# Patient Record
Sex: Female | Born: 1990 | Race: Black or African American | Hispanic: No | Marital: Single | Smoking: Former smoker
Health system: Southern US, Community
[De-identification: ages and names within clinical notes are randomized; demographics above are authoritative.]

## PROBLEM LIST (undated history)

## (undated) DIAGNOSIS — F329 Major depressive disorder, single episode, unspecified: Secondary | ICD-10-CM

## (undated) DIAGNOSIS — F32A Depression, unspecified: Secondary | ICD-10-CM

## (undated) DIAGNOSIS — K259 Gastric ulcer, unspecified as acute or chronic, without hemorrhage or perforation: Secondary | ICD-10-CM

## (undated) DIAGNOSIS — J449 Chronic obstructive pulmonary disease, unspecified: Secondary | ICD-10-CM

## (undated) HISTORY — PX: WISDOM TOOTH EXTRACTION: SHX21

---

## 2016-03-16 ENCOUNTER — Emergency Department (HOSPITAL_BASED_OUTPATIENT_CLINIC_OR_DEPARTMENT_OTHER)
Admission: EM | Admit: 2016-03-16 | Discharge: 2016-03-16 | Disposition: A | Discharge status: Home / Self Care | Payer: Medicaid Other | Attending: Emergency Medicine | Admitting: Emergency Medicine

## 2016-03-16 ENCOUNTER — Encounter (HOSPITAL_BASED_OUTPATIENT_CLINIC_OR_DEPARTMENT_OTHER): Payer: Self-pay | Admitting: Emergency Medicine

## 2016-03-16 DIAGNOSIS — J449 Chronic obstructive pulmonary disease, unspecified: Secondary | ICD-10-CM | POA: Diagnosis not present

## 2016-03-16 DIAGNOSIS — R51 Headache: Secondary | ICD-10-CM | POA: Diagnosis not present

## 2016-03-16 DIAGNOSIS — F17200 Nicotine dependence, unspecified, uncomplicated: Secondary | ICD-10-CM | POA: Diagnosis not present

## 2016-03-16 DIAGNOSIS — R05 Cough: Secondary | ICD-10-CM | POA: Insufficient documentation

## 2016-03-16 DIAGNOSIS — R509 Fever, unspecified: Secondary | ICD-10-CM | POA: Diagnosis present

## 2016-03-16 DIAGNOSIS — R69 Illness, unspecified: Secondary | ICD-10-CM

## 2016-03-16 DIAGNOSIS — J111 Influenza due to unidentified influenza virus with other respiratory manifestations: Secondary | ICD-10-CM

## 2016-03-16 HISTORY — DX: Chronic obstructive pulmonary disease, unspecified: J44.9

## 2016-03-16 MED ORDER — OSELTAMIVIR PHOSPHATE 75 MG PO CAPS: 75.0000 mg | ORAL_CAPSULE | Freq: Two times a day (BID) | ORAL | 0 refills | Status: DC

## 2016-03-16 NOTE — Discharge Instructions (Signed)
It was our pleasure to provide your ER care today - we hope that you feel better.  Rest. Drink plenty of fluids.  Take tamiflu as prescribed.  You may also try over the counter cold and flu medication as need for symptom relief.  Follow up with primary care doctor in 1 week if symptoms fail to improve/resolve.  Return to ER if worse, trouble breathing, other concern.

## 2016-03-16 NOTE — ED Triage Notes (Signed)
Nasal congestion, sore throat, cough, fever, body aches since Saturday/sunday.  No N/V/D.

## 2016-03-16 NOTE — ED Provider Notes (Signed)
[facility]   CSN: [id number] Arrival date & time: 03/16/16  [id number]     History   Chief Complaint Chief Complaint  Patient presents with  . URI    HPI Kara Fischer is a 26 y.o. female.  Patient c/o fever, scratchy throat, non prod cough, intermittent frontal headaches, and body aches, for the past 1-2 days. Symptoms constant, persistent, acute in onset. Recent ill contacts w uri symptoms. No sob. No chest pain. No abd pain or nvd. No gu c/o. No neck pain or stiffness. No hx chr illness.     URI   Associated symptoms include congestion, headaches and cough. Pertinent negatives include no chest pain, no abdominal pain, no dysuria, no neck pain and no rash.    Past Medical History:  Diagnosis Date  . COPD (chronic obstructive pulmonary disease) (HCC)     There are no active problems to display for this patient.   No past surgical history on file.  OB History    No data available       Home Medications    Prior to Admission medications   Not on File    Family History No family history on file.  Social History Social History  Substance Use Topics  . Smoking status: Current Every Day Smoker  . Smokeless tobacco: Never Used  . Alcohol use Not on file     Allergies   Patient has no known allergies.   Review of Systems Review of Systems  Constitutional: Positive for fever.  HENT: Positive for congestion.   Eyes: Negative for redness.  Respiratory: Positive for cough. Negative for shortness of breath.   Cardiovascular: Negative for chest pain.  Gastrointestinal: Negative for abdominal pain.  Genitourinary: Negative for dysuria.  Musculoskeletal: Positive for myalgias. Negative for back pain, neck pain and neck stiffness.  Skin: Negative for rash.  Neurological: Positive for headaches.  Hematological: Does not bruise/bleed easily.  Psychiatric/Behavioral: Negative for confusion.     Physical Exam Updated Vital Signs BP  116/80 (BP Location: Left Arm)   Pulse 74   Temp 98 F (36.7 C) (Oral)   Resp 18   Ht 5\' 4"  (1.626 m)   Wt 77.1 kg   LMP 03/07/2016   SpO2 100%   BMI 29.18 kg/m   Physical Exam  Constitutional: She appears well-developed and well-nourished. No distress.  HENT:  Mouth/Throat: Oropharynx is clear and moist.  Nasal congestion  Eyes: Conjunctivae are normal. No scleral icterus.  Neck: Neck supple. No tracheal deviation present.  No stiffness or rigidity  Cardiovascular: Normal rate, regular rhythm, normal heart sounds and intact distal pulses.  Exam reveals no gallop and no friction rub.   No murmur heard. Pulmonary/Chest: Effort normal and breath sounds normal. No respiratory distress.  Abdominal: Soft. Normal appearance. She exhibits no distension. There is no tenderness.  Genitourinary:  Genitourinary Comments: No cva tenderness  Musculoskeletal: She exhibits no edema.  Neurological: She is alert.  Skin: Skin is warm and dry. No rash noted. She is not diaphoretic.  Psychiatric: She has a normal mood and affect.  Nursing note and vitals reviewed.    ED Treatments / Results  Labs (all labs ordered are listed, but only abnormal results are displayed) Labs Reviewed - No data to display  EKG  EKG Interpretation None       Radiology No results found.  Procedures Procedures (including critical care time)  Medications Ordered in ED Medications - No data to display  Initial Impression / Assessment and Plan / ED Course  I have reviewed the triage vital signs and the nursing notes.  Pertinent labs & imaging results that were available during my care of the patient were reviewed by me and considered in my medical decision making (see chart for details).  pts symptoms and exam felt most c/w influenza like illness.  Will give rx tamiflu.  Return precautions provided.    Final Clinical Impressions(s) / ED Diagnoses   Final diagnoses:  None    New  Prescriptions New Prescriptions   No medications on file     Cathren Laine, MD 03/16/16 [phone]

## 2016-04-18 ENCOUNTER — Emergency Department (HOSPITAL_BASED_OUTPATIENT_CLINIC_OR_DEPARTMENT_OTHER)
Admission: EM | Admit: 2016-04-18 | Discharge: 2016-04-18 | Disposition: A | Discharge status: Home / Self Care | Payer: Medicaid Other | Attending: Emergency Medicine | Admitting: Emergency Medicine

## 2016-04-18 ENCOUNTER — Encounter (HOSPITAL_BASED_OUTPATIENT_CLINIC_OR_DEPARTMENT_OTHER): Payer: Self-pay

## 2016-04-18 DIAGNOSIS — L02416 Cutaneous abscess of left lower limb: Secondary | ICD-10-CM | POA: Diagnosis present

## 2016-04-18 DIAGNOSIS — F17200 Nicotine dependence, unspecified, uncomplicated: Secondary | ICD-10-CM | POA: Diagnosis not present

## 2016-04-18 DIAGNOSIS — Z79899 Other long term (current) drug therapy: Secondary | ICD-10-CM | POA: Diagnosis not present

## 2016-04-18 DIAGNOSIS — J449 Chronic obstructive pulmonary disease, unspecified: Secondary | ICD-10-CM | POA: Diagnosis not present

## 2016-04-18 DIAGNOSIS — L02419 Cutaneous abscess of limb, unspecified: Secondary | ICD-10-CM

## 2016-04-18 HISTORY — DX: Depression, unspecified: F32.A

## 2016-04-18 HISTORY — DX: Major depressive disorder, single episode, unspecified: F32.9

## 2016-04-18 MED ORDER — LIDOCAINE HCL (PF) 1 % IJ SOLN
5.0000 mL | Freq: Once | INTRAMUSCULAR | Status: AC
  Administered 2016-04-18: 5 mL
  Filled 2016-04-18: qty 5

## 2016-04-18 MED ORDER — SULFAMETHOXAZOLE-TRIMETHOPRIM 800-160 MG PO TABS: 1.0000 | ORAL_TABLET | Freq: Two times a day (BID) | ORAL | 0 refills | Status: AC

## 2016-04-18 MED FILL — SULFAMETHOXAZOLE/TMP DS TAB: 800-160 | 7 days supply | Qty: 14 | Fill #0

## 2016-04-18 NOTE — ED Triage Notes (Signed)
C/o abscess to left inner thigh x 1 week-NAD-slow steady gait

## 2016-04-18 NOTE — ED Provider Notes (Signed)
[facility]   CSN: [id number] Arrival date & time: 04/18/16  1226     History   Chief Complaint Chief Complaint  Patient presents with  . Abscess    HPI Kara Fischer is a 26 y.o. female.  HPI  26 y.o. femalepresents to the Emergency Department today complaining of left thigh abscess x 1 week. Notes worsening over that time period with it growing. Attempted warm compresses without minimal relief. No N/V. No fevers. Minimal pain currently. No other symptoms noted.   Past Medical History:  Diagnosis Date  . COPD (chronic obstructive pulmonary disease) (HCC)   . Depression     There are no active problems to display for this patient.   History reviewed. No pertinent surgical history.  OB History    No data available       Home Medications    Prior to Admission medications   Medication Sig Start Date End Date Taking? Authorizing Provider  ARIPiprazole (ABILIFY PO) Take by mouth.   Yes Historical Provider, MD  TRAZODONE HCL PO Take by mouth.   Yes Historical Provider, MD    Family History No family history on file.  Social History Social History  Substance Use Topics  . Smoking status: Current Every Day Smoker  . Smokeless tobacco: Never Used  . Alcohol use No     Allergies   Patient has no known allergies.   Review of Systems Review of Systems ROS reviewed and all are negative for acute change except as noted in the HPI.  Physical Exam Updated Vital Signs BP 134/89 (BP Location: Left Arm)   Pulse 107   Temp 98.2 F (36.8 C) (Oral)   Resp 18   Ht 5\' 4"  (1.626 m)   Wt 83.3 kg   LMP 04/08/2016   SpO2 100%   BMI 31.53 kg/m   Physical Exam  Constitutional: She is oriented to person, place, and time. Vital signs are normal. She appears well-developed and well-nourished.  HENT:  Head: Normocephalic and atraumatic.  Right Ear: Hearing normal.  Left Ear: Hearing normal.  Eyes: Conjunctivae and EOM are normal. Pupils  are equal, round, and reactive to light.  Neck: Normal range of motion. Neck supple.  Cardiovascular: Normal rate, regular rhythm, normal heart sounds and intact distal pulses.   Pulmonary/Chest: Effort normal.  Abdominal: Soft.  Musculoskeletal: Normal range of motion.  1cm induration left inner thigh. Surrounding erythema. No purulence  Neurological: She is alert and oriented to person, place, and time.  Skin: Skin is warm and dry.  Psychiatric: She has a normal mood and affect. Her speech is normal and behavior is normal. Thought content normal.  Nursing note and vitals reviewed.  ED Treatments / Results  Labs (all labs ordered are listed, but only abnormal results are displayed) Labs Reviewed - No data to display  EKG  EKG Interpretation None      Radiology No results found.  Procedures .Marland KitchenIncision and Drainage Date/Time: 04/18/2016 3:37 PM Performed by: Audry Pili Authorized by: Audry Pili   Consent:    Consent obtained:  Verbal   Consent given by:  Patient Location:    Type:  Abscess   Size:  1   Location:  Lower extremity   Lower extremity location:  Leg   Leg location:  L upper leg Pre-procedure details:    Skin preparation:  Betadine Anesthesia (see MAR for exact dosages):    Anesthesia method:  Local infiltration   Local anesthetic:  Lidocaine 1% w/o epi Procedure type:    Complexity:  Simple Procedure details:    Incision types:  Stab incision   Incision depth:  Dermal   Scalpel blade:  15   Wound management:  Probed and deloculated   Drainage:  Purulent   Drainage amount:  Moderate   Wound treatment:  Wound left open   Packing materials:  1/2 in gauze Post-procedure details:    Patient tolerance of procedure:  Tolerated well, no immediate complications   (including critical care time)  Medications Ordered in ED Medications  lidocaine (PF) (XYLOCAINE) 1 % injection 5 mL (5 mLs Infiltration Given 04/18/16 1513)    Initial Impression /  Assessment and Plan / ED Course  I have reviewed the triage vital signs and the nursing notes.  Pertinent labs & imaging results that were available during my care of the patient were reviewed by me and considered in my medical decision making (see chart for details).  Final Clinical Impressions(s) / ED Diagnoses     {I have reviewed the relevant previous healthcare records.  {I obtained HPI from historian.   ED Course:  Assessment: Pt is a 26 y.o. female who presents to the Emergency Department with skin abscess amenable to incision and drainage. Mild erythema surrounding skin. Given Rx Abx, Will d/c to home. Strict return precautions given. Follow up with PCP.  Disposition/Plan:  DC Home Additional Verbal discharge instructions given and discussed with patient.  Pt Instructed to f/u with PCP in the next week for evaluation and treatment of symptoms. Return precautions given Pt acknowledges and agrees with plan  Supervising Physician Vanetta MuldersScott Zackowski, MD  Final diagnoses:  Thigh abscess    New Prescriptions New Prescriptions   No medications on file     Audry Piliyler Aubriegh Minch, PA-C 04/18/16 1538    Vanetta MuldersScott Zackowski, MD 04/20/16 [phone]

## 2016-04-18 NOTE — Discharge Instructions (Signed)
Please read and follow all provided instructions.  Your diagnoses today include:  1. Thigh abscess     Tests performed today include: Vital signs. See below for your results today.   Medications prescribed:   Take any prescribed medications only as directed.   Home care instructions:  Follow any educational materials contained in this packet  Follow-up instructions: Return to the Emergency Department in 48 hours for a recheck if your symptoms are not significantly improved.  Please follow-up with your primary care provider in the next 1 week for further evaluation of your symptoms.   Return instructions:  Return to the Emergency Department if you have: Fever Worsening symptoms Worsening pain Worsening swelling Redness of the skin that moves away from the affected area, especially if it streaks away from the affected area  Any other emergent concerns  Additional Information: If you have recurrent abscesses, try both the following. Use a Qtip to apply an over-the-counter antibiotic to the inside of your nostrils, twice a day for 5 days. Wash your body with over-the-counter Hibaclens once a day for one week and then once every two weeks. This can reduce the amount of bacterial on your skin that causes boils and lead to fewer boils. If you continue to have multiple or recurrent boils, you should see a dermatologist (skin doctor).   Your vital signs today were: BP 120/76 (BP Location: Right Arm)    Pulse 87    Temp 98.2 F (36.8 C) (Oral)    Resp 16    Ht 5\' 4"  (1.626 m)    Wt 83.3 kg    LMP 04/08/2016    SpO2 100%    BMI 31.53 kg/m  If your blood pressure (BP) was elevated above 135/85 this visit, please have this repeated by your doctor within one month. --------------

## 2016-05-17 ENCOUNTER — Emergency Department (HOSPITAL_BASED_OUTPATIENT_CLINIC_OR_DEPARTMENT_OTHER)
Admission: EM | Admit: 2016-05-17 | Discharge: 2016-05-17 | Disposition: A | Discharge status: Home / Self Care | Payer: Medicaid Other | Attending: Emergency Medicine | Admitting: Emergency Medicine

## 2016-05-17 ENCOUNTER — Encounter (HOSPITAL_BASED_OUTPATIENT_CLINIC_OR_DEPARTMENT_OTHER): Payer: Self-pay | Admitting: *Deleted

## 2016-05-17 DIAGNOSIS — J449 Chronic obstructive pulmonary disease, unspecified: Secondary | ICD-10-CM | POA: Insufficient documentation

## 2016-05-17 DIAGNOSIS — F17200 Nicotine dependence, unspecified, uncomplicated: Secondary | ICD-10-CM | POA: Insufficient documentation

## 2016-05-17 DIAGNOSIS — G43009 Migraine without aura, not intractable, without status migrainosus: Secondary | ICD-10-CM

## 2016-05-17 DIAGNOSIS — R51 Headache: Secondary | ICD-10-CM | POA: Diagnosis present

## 2016-05-17 LAB — PREGNANCY, URINE: PREG TEST UR: NEGATIVE

## 2016-05-17 MED ORDER — METOCLOPRAMIDE HCL 5 MG/ML IJ SOLN
10.0000 mg | Freq: Once | INTRAMUSCULAR | Status: AC
  Administered 2016-05-17: 10 mg via INTRAVENOUS
  Filled 2016-05-17: qty 2

## 2016-05-17 MED ORDER — SODIUM CHLORIDE 0.9 % IV BOLUS (SEPSIS)
1000.0000 mL | Freq: Once | INTRAVENOUS | Status: AC
  Administered 2016-05-17: 1000 mL via INTRAVENOUS

## 2016-05-17 MED ORDER — DEXAMETHASONE SODIUM PHOSPHATE 10 MG/ML IJ SOLN
10.0000 mg | Freq: Once | INTRAMUSCULAR | Status: AC
  Administered 2016-05-17: 10 mg via INTRAVENOUS
  Filled 2016-05-17: qty 1

## 2016-05-17 MED ORDER — KETOROLAC TROMETHAMINE 30 MG/ML IJ SOLN
30.0000 mg | Freq: Once | INTRAMUSCULAR | Status: AC
  Administered 2016-05-17: 30 mg via INTRAVENOUS
  Filled 2016-05-17: qty 1

## 2016-05-17 MED ORDER — DIPHENHYDRAMINE HCL 50 MG/ML IJ SOLN
25.0000 mg | Freq: Once | INTRAMUSCULAR | Status: AC
  Administered 2016-05-17: 25 mg via INTRAVENOUS
  Filled 2016-05-17: qty 1

## 2016-05-17 NOTE — ED Notes (Signed)
PT states understanding of care given, follow up care. PT ambulated from ED to car with a steady gait.  

## 2016-05-17 NOTE — ED Provider Notes (Signed)
[facility]   CSN: [id number] Arrival date & time: 05/17/16  1447     History   Chief Complaint Chief Complaint  Patient presents with  . Headache    HPI Kara Fischer is a 26 y.o. female.  The history is provided by the patient.  Headache   This is a new problem. Episode onset: 3 days. The problem occurs constantly. The problem has been gradually worsening. The headache is associated with bright light and loud noise. The pain is located in the temporal and frontal region. The quality of the pain is described as dull. The pain is moderate. The pain radiates to the left neck and right neck. Pertinent negatives include no fever, no syncope and no vomiting. She has tried acetaminophen and NSAIDs for the symptoms. The treatment provided no relief.    Past Medical History:  Diagnosis Date  . COPD (chronic obstructive pulmonary disease) (HCC)   . Depression     There are no active problems to display for this patient.   History reviewed. No pertinent surgical history.  OB History    No data available       Home Medications    Prior to Admission medications   Medication Sig Start Date End Date Taking? Authorizing Provider  ARIPiprazole (ABILIFY PO) Take by mouth.    Historical Provider, MD  TRAZODONE HCL PO Take by mouth.    Historical Provider, MD    Family History No family history on file.  Social History Social History  Substance Use Topics  . Smoking status: Current Every Day Smoker  . Smokeless tobacco: Never Used  . Alcohol use No     Allergies   Patient has no known allergies.   Review of Systems Review of Systems  Constitutional: Negative for fever.  Cardiovascular: Negative for syncope.  Gastrointestinal: Negative for vomiting.  Neurological: Positive for headaches.  All other systems reviewed and are negative.    Physical Exam Updated Vital Signs BP 100/75 (BP Location: Left Arm)   Pulse 95   Temp 97.8 F  (36.6 C) (Oral)   Resp 18   Ht  (1.626 m)   Wt 188 lb (85.3 kg)   LMP 05/03/2016 (Approximate)   SpO2 100%   BMI 32.27 kg/m   Physical Exam  Constitutional: She is oriented to person, place, and time. She appears well-developed and well-nourished. No distress.  HENT:  Head: Normocephalic.  Nose: Nose normal.  Eyes: Conjunctivae and EOM are normal. Pupils are equal, round, and reactive to light.  Neck: Neck supple. No tracheal deviation present.  Cardiovascular: Normal rate and regular rhythm.   Pulmonary/Chest: Effort normal. No respiratory distress.  Abdominal: Soft. She exhibits no distension.  Neurological: She is alert and oriented to person, place, and time. No cranial nerve deficit. Coordination normal.  Normal finger to nose testing and rapid alternating movement  Skin: Skin is warm and dry.  Psychiatric: She has a normal mood and affect.     ED Treatments / Results  Labs (all labs ordered are listed, but only abnormal results are displayed) Labs Reviewed  PREGNANCY, URINE    EKG  EKG Interpretation None       Radiology No results found.  Procedures Procedures (including critical care time)  Medications Ordered in ED Medications  sodium chloride 0.9 % bolus 1,000 mL (1,000 mLs Intravenous New Bag/Given 05/17/16 1539)  metoCLOPramide (REGLAN) injection 10 mg (10 mg Intravenous Given 05/17/16 1542)  diphenhydrAMINE (BENADRYL) injection 25  mg (25 mg Intravenous Given 05/17/16 1543)  ketorolac (TORADOL) 30 MG/ML injection 30 mg (30 mg Intravenous Given 05/17/16 1544)  dexamethasone (DECADRON) injection 10 mg (10 mg Intravenous Given 05/17/16 1541)     Initial Impression / Assessment and Plan / ED Course  I have reviewed the triage vital signs and the nursing notes.  Pertinent labs & imaging results that were available during my care of the patient were reviewed by me and considered in my medical decision making (see chart for details).     26 y.o. female  presents with migraine headache that is similar to prior starting 3 days ago that have been diagnosed previously as tension headaches but now has sensitivity to light and sound. Not responding to supportive care measures at home. No neurologic deficits here and otherwise well appearing despite discomfort. No red flag symptoms for headache, no signs or symptoms of spontaneous ICH. Provided migraine cocktail with good relief of symptoms. Plan to follow up with PCP as needed and return precautions discussed for worsening or new concerning symptoms.   Final Clinical Impressions(s) / ED Diagnoses   Final diagnoses:  Migraine without aura and without status migrainosus, not intractable    New Prescriptions New Prescriptions   No medications on file     Lyndal Pulley, MD 05/17/16 1740

## 2016-05-17 NOTE — ED Triage Notes (Signed)
Pt here for HA x3 days.  Pt has hx of tension headaches. Pt states that she has sensitivity to light and has not found no relief with ibuprofen or tylenol.  No nausea with this. Neck supple

## 2016-08-23 ENCOUNTER — Encounter (HOSPITAL_BASED_OUTPATIENT_CLINIC_OR_DEPARTMENT_OTHER): Payer: Self-pay

## 2016-08-23 ENCOUNTER — Emergency Department (HOSPITAL_BASED_OUTPATIENT_CLINIC_OR_DEPARTMENT_OTHER)
Admission: EM | Admit: 2016-08-23 | Discharge: 2016-08-23 | Disposition: A | Discharge status: Home / Self Care | Payer: Medicaid Other | Attending: Emergency Medicine | Admitting: Emergency Medicine

## 2016-08-23 ENCOUNTER — Emergency Department (HOSPITAL_BASED_OUTPATIENT_CLINIC_OR_DEPARTMENT_OTHER): Payer: Medicaid Other

## 2016-08-23 DIAGNOSIS — F17200 Nicotine dependence, unspecified, uncomplicated: Secondary | ICD-10-CM | POA: Diagnosis not present

## 2016-08-23 DIAGNOSIS — B9789 Other viral agents as the cause of diseases classified elsewhere: Secondary | ICD-10-CM | POA: Insufficient documentation

## 2016-08-23 DIAGNOSIS — J449 Chronic obstructive pulmonary disease, unspecified: Secondary | ICD-10-CM | POA: Insufficient documentation

## 2016-08-23 DIAGNOSIS — R05 Cough: Secondary | ICD-10-CM | POA: Diagnosis present

## 2016-08-23 DIAGNOSIS — J069 Acute upper respiratory infection, unspecified: Secondary | ICD-10-CM

## 2016-08-23 LAB — RAPID STREP SCREEN (MED CTR MEBANE ONLY): Streptococcus, Group A Screen (Direct): NEGATIVE

## 2016-08-23 MED ORDER — DEXAMETHASONE 1 MG/ML PO CONC
10.0000 mg | Freq: Once | ORAL | Status: AC
  Administered 2016-08-23: 10 mg via ORAL
  Filled 2016-08-23: qty 10

## 2016-08-23 MED ORDER — BENZONATATE 100 MG PO CAPS
100.0000 mg | ORAL_CAPSULE | Freq: Once | ORAL | Status: AC
  Administered 2016-08-23: 100 mg via ORAL
  Filled 2016-08-23: qty 1

## 2016-08-23 MED ORDER — FLUTICASONE PROPIONATE 50 MCG/ACT NA SUSP: 2.0000 | Freq: Every day | NASAL | 12 refills | Status: DC

## 2016-08-23 MED ORDER — GUAIFENESIN-CODEINE 100-10 MG/5ML PO SOLN: 5.0000 mL | Freq: Three times a day (TID) | ORAL | 0 refills | Status: DC | PRN

## 2016-08-23 MED ORDER — KETOROLAC TROMETHAMINE 30 MG/ML IJ SOLN
30.0000 mg | Freq: Once | INTRAMUSCULAR | Status: AC
  Administered 2016-08-23: 30 mg via INTRAMUSCULAR
  Filled 2016-08-23: qty 1

## 2016-08-23 MED FILL — VIRTUSSIN AC LIQUID: 100-10 | 4 days supply | Qty: 50 | Fill #0

## 2016-08-23 NOTE — ED Triage Notes (Signed)
C/o flu like sx x 3 days-NAD-steady gait 

## 2016-08-23 NOTE — Discharge Instructions (Signed)
Her strep test was normal. Her chest x-ray shows no signs of infection.  this is likely a a viral illness that does not require antibiotics at this time. Treat symptomatically with Flonase for nasal congestion and cough syrup. Motrin and Tylenol for pain and fever. Make she follow up with her primary care doctor if her symptoms not improving in next 5-6 days. Return to the ED if she develops worsening symptoms.

## 2016-08-26 LAB — CULTURE, GROUP A STREP (THRC)

## 2016-08-26 NOTE — ED Provider Notes (Signed)
[facility]-EMERGENCY DEPT Provider Note   CSN: [id number] Arrival date & time: 08/23/16  1319     History   Chief Complaint Chief Complaint  Patient presents with  . Cough    HPI Kara Fischer is a 26 y.o. female.  HPI 26 red African American female past medical history significant for COPD, depression presents to the emergency Department today with complaints of flulike symptoms specifically sore throat, bilateral otalgia, rhinorrhea, nonproductive cough. Patient states that her symptoms started 3 days ago Gradually worsened. Painful to swallow. Bilateral otalgia without any drainage. Denies any fevers. Does report chills. Endorses generalized myalgias. She has tried several over-the-counter medications for her symptoms with little relief. Denies any sick contacts. Nothing makes her symptoms better or worse.  Denies any fever, headache, vision changes, lightheadedness, dizziness, neck stiffness, shortness of breath, chest pain, nausea, vomiting, abdominal pain, urinary symptoms. Past Medical History:  Diagnosis Date  . COPD (chronic obstructive pulmonary disease) (HCC)   . Depression     There are no active problems to display for this patient.   History reviewed. No pertinent surgical history.  OB History    No data available       Home Medications    Prior to Admission medications   Medication Sig Start Date End Date Taking? Authorizing Provider  BusPIRone HCl (BUSPAR PO) Take by mouth.   Yes [provider]  RisperiDONE (RISPERDAL PO) Take by mouth.   Yes [provider]  fluticasone (FLONASE) 50 MCG/ACT nasal spray Place 2 sprays into both nostrils daily. 08/23/16   Rise MuLeaphart, Kenneth T, PA-C  guaiFENesin-codeine 100-10 MG/5ML syrup Take 5 mLs by mouth 3 (three) times daily as needed for cough. 08/23/16   Demetrios LollLeaphart, Kenneth T, PA-C  TRAZODONE HCL PO Take by mouth.    [provider]    Family History No family history on file.  Social  History Social History  Substance Use Topics  . Smoking status: Current Every Day Smoker  . Smokeless tobacco: Never Used  . Alcohol use Yes     Comment: occ     Allergies   Patient has no known allergies.   Review of Systems Review of Systems  Constitutional: Positive for chills. Negative for fever.  HENT: Positive for congestion, ear pain, rhinorrhea, sore throat and trouble swallowing. Negative for ear discharge.   Respiratory: Positive for cough. Negative for shortness of breath.   Cardiovascular: Negative for chest pain.  Gastrointestinal: Negative for abdominal pain, diarrhea, nausea and vomiting.  Genitourinary: Negative for dysuria, hematuria and urgency.  Musculoskeletal: Positive for myalgias. Negative for neck stiffness.  Skin: Negative for rash.  Neurological: Negative for dizziness, syncope, weakness, light-headedness and headaches.     Physical Exam Updated Vital Signs BP 117/78 (BP Location: Left Arm)   Pulse 91   Temp 98.5 F (36.9 C) (Oral)   Resp 18   Ht 5\' 4"  (1.626 m)   Wt 93 kg (205 lb)   LMP 08/07/2016   SpO2 99%   BMI 35.19 kg/m   Physical Exam  Constitutional: She is oriented to person, place, and time. She appears well-developed and well-nourished. No distress.  HENT:  Head: Normocephalic and atraumatic.  Right Ear: Tympanic membrane, external ear and ear canal normal.  Left Ear: Tympanic membrane, external ear and ear canal normal.  Nose: Mucosal edema and rhinorrhea present.  Mouth/Throat: Uvula is midline and mucous membranes are normal. No trismus in the jaw. No uvula swelling. Posterior oropharyngeal erythema present. No oropharyngeal  exudate, posterior oropharyngeal edema or tonsillar abscesses. Tonsils are 1+ on the right. Tonsils are 1+ on the left. No tonsillar exudate.  Eyes: Conjunctivae are normal. Right eye exhibits no discharge. Left eye exhibits no discharge. No scleral icterus.  Neck: Normal range of motion. Neck supple.  No  c spine midline tenderness. No paraspinal tenderness. No deformities or step offs noted. Full ROM. Supple. No nuchal rigidity.    Cardiovascular: Intact distal pulses.   Pulmonary/Chest: Effort normal and breath sounds normal. No respiratory distress. She has no wheezes. She has no rales. She exhibits no tenderness.  Musculoskeletal: Normal range of motion.  Lymphadenopathy:    She has no cervical adenopathy.  Neurological: She is alert and oriented to person, place, and time.  Skin: Skin is warm and dry. Capillary refill takes less than 2 seconds. No pallor.  Psychiatric: Her behavior is normal. Judgment and thought content normal.  Nursing note and vitals reviewed.    ED Treatments / Results  Labs (all labs ordered are listed, but only abnormal results are displayed) Labs Reviewed  RAPID STREP SCREEN (NOT AT [facility])  CULTURE, GROUP A STREP [facility])    EKG  EKG Interpretation None       Radiology No results found.  Procedures Procedures (including critical care time)  Medications Ordered in ED Medications  benzonatate (TESSALON) capsule 100 mg (100 mg Oral Given 08/23/16 1418)  ketorolac (TORADOL) 30 MG/ML injection 30 mg (30 mg Intramuscular Given 08/23/16 1526)  dexamethasone (DECADRON) 1 MG/ML solution 10 mg (10 mg Oral Given 08/23/16 1525)     Initial Impression / Assessment and Plan / ED Course  I have reviewed the triage vital signs and the nursing notes.  Pertinent labs & imaging results that were available during my care of the patient were reviewed by me and considered in my medical decision making (see chart for details).     Pt afebrile without tonsillar exudate, negative strep. Presents with mild dysphagia; diagnosis of viral pharyngitis. No abx indicated. DC w symptomatic tx for pain  Pt does not appear dehydrated, but did discuss importance of water rehydration. Presentation non concerning for PTA or infxn spread to soft tissue. No trismus or uvula deviation.  Specific return precautions discussed. Pt able to drink water in ED without difficulty with intact air way. Pt CXR negative for acute infiltrate. Verbalizes understanding and is agreeable with plan. Pt is hemodynamically stable & in NAD prior to dc. Recommended PCP follow up.   Final Clinical Impressions(s) / ED Diagnoses   Final diagnoses:  Viral URI with cough    New Prescriptions Discharge Medication List as of 08/23/2016  3:34 PM    START taking these medications   Details  fluticasone (FLONASE) 50 MCG/ACT nasal spray Place 2 sprays into both nostrils daily., Starting Tue 08/23/2016, Print    guaiFENesin-codeine 100-10 MG/5ML syrup Take 5 mLs by mouth 3 (three) times daily as needed for cough., Starting Tue 08/23/2016, Print         Rise Mu, PA-C 08/26/16 1225    Tegeler, Canary Brim, MD 08/27/16 0700

## 2016-10-12 ENCOUNTER — Encounter (HOSPITAL_BASED_OUTPATIENT_CLINIC_OR_DEPARTMENT_OTHER): Payer: Self-pay

## 2016-10-12 ENCOUNTER — Emergency Department (HOSPITAL_BASED_OUTPATIENT_CLINIC_OR_DEPARTMENT_OTHER)
Admission: EM | Admit: 2016-10-12 | Discharge: 2016-10-12 | Disposition: A | Discharge status: Home / Self Care | Payer: Medicaid Other | Attending: Emergency Medicine | Admitting: Emergency Medicine

## 2016-10-12 DIAGNOSIS — N760 Acute vaginitis: Secondary | ICD-10-CM | POA: Diagnosis not present

## 2016-10-12 DIAGNOSIS — Z87891 Personal history of nicotine dependence: Secondary | ICD-10-CM | POA: Diagnosis not present

## 2016-10-12 DIAGNOSIS — J449 Chronic obstructive pulmonary disease, unspecified: Secondary | ICD-10-CM | POA: Insufficient documentation

## 2016-10-12 DIAGNOSIS — N926 Irregular menstruation, unspecified: Secondary | ICD-10-CM

## 2016-10-12 DIAGNOSIS — B9689 Other specified bacterial agents as the cause of diseases classified elsewhere: Secondary | ICD-10-CM

## 2016-10-12 DIAGNOSIS — R102 Pelvic and perineal pain: Secondary | ICD-10-CM | POA: Diagnosis present

## 2016-10-12 DIAGNOSIS — Z79899 Other long term (current) drug therapy: Secondary | ICD-10-CM | POA: Insufficient documentation

## 2016-10-12 LAB — URINALYSIS, ROUTINE W REFLEX MICROSCOPIC
Bilirubin Urine: NEGATIVE
GLUCOSE, UA: NEGATIVE mg/dL
KETONES UR: NEGATIVE mg/dL
NITRITE: NEGATIVE
Protein, ur: NEGATIVE mg/dL
Specific Gravity, Urine: 1.015 (ref 1.005–1.030)
pH: 7.5 (ref 5.0–8.0)

## 2016-10-12 LAB — WET PREP, GENITAL
SPERM: NONE SEEN
Trich, Wet Prep: NONE SEEN
YEAST WET PREP: NONE SEEN

## 2016-10-12 LAB — URINALYSIS, MICROSCOPIC (REFLEX)

## 2016-10-12 LAB — PREGNANCY, URINE: PREG TEST UR: NEGATIVE

## 2016-10-12 MED ORDER — METRONIDAZOLE 500 MG PO TABS: 500.0000 mg | ORAL_TABLET | Freq: Two times a day (BID) | ORAL | 0 refills | Status: AC

## 2016-10-12 NOTE — ED Provider Notes (Signed)
[facility] Provider Note   CSN: [id number] Arrival date & time: 10/12/16  1543     History   Chief Complaint Chief Complaint  Patient presents with  . Pelvic Pain    HPI Kara Fischer is a 26 y.o. female presents to the ED for evaluation of intermittent bright bleeding on toilet paper after urination only x one week associated with bilateral pelvic pain 2 days. States bleeding "looks like my period" but only happens when she urinates and not during the day. Pelvic pain is sharp, intermittent and on both sides and does not radiate. Reports irregular periods for the last couple of months, usually bleeds every 2 weeks. Last normal menstrual period was 2 weeks ago. No birth control pills, last IUD 4 years ago. Has not seen OB/GYN for irregular bleeding. Is sexually active with women only without barrier methods of STD protection. Tylenol does not help with pelvic pain. Denies vaginal itching, odor or discharge, fevers, nausea, vomiting, back pain. No known bleeding or clotting disorders. No h/o kidney stones. Mother has uterine fibroids and her sister just had surgery for endometriosis.  HPI  Past Medical History:  Diagnosis Date  . COPD (chronic obstructive pulmonary disease) (HCC)   . Depression     There are no active problems to display for this patient.   History reviewed. No pertinent surgical history.  OB History    No data available       Home Medications    Prior to Admission medications   Medication Sig Start Date End Date Taking? Authorizing Provider  Escitalopram Oxalate (LEXAPRO PO) Take by mouth.   Yes [provider]  BusPIRone HCl (BUSPAR PO) Take by mouth.    [provider]  metroNIDAZOLE (FLAGYL) 500 MG tablet Take 1 tablet (500 mg total) by mouth 2 (two) times daily. 10/12/16   Liberty Handy, PA-C  TRAZODONE HCL PO Take by mouth.    [provider]    Family History No family history on file.  Social  History Social History  Substance Use Topics  . Smoking status: Former Games developer  . Smokeless tobacco: Never Used  . Alcohol use Yes     Comment: occ     Allergies   Patient has no known allergies.   Review of Systems Review of Systems  Constitutional: Negative for chills, diaphoresis and fever.  Respiratory: Negative for cough and shortness of breath.   Cardiovascular: Negative for chest pain.  Gastrointestinal: Negative for abdominal pain, diarrhea, nausea and vomiting.  Genitourinary: Positive for difficulty urinating, hematuria, menstrual problem, pelvic pain and vaginal bleeding. Negative for dysuria, flank pain, frequency, vaginal discharge and vaginal pain.  Musculoskeletal: Negative for back pain.  Neurological: Negative for headaches.     Physical Exam Updated Vital Signs BP 100/70 (BP Location: Right Arm)   Pulse 62   Temp 98.7 F (37.1 C) (Oral)   Resp 18   Ht 5\' 4"  (1.626 m)   Wt 92.1 kg (203 lb)   LMP 09/23/2016   SpO2 100%   BMI 34.84 kg/m   Physical Exam  Constitutional: She is oriented to person, place, and time. She appears well-developed and well-nourished. No distress.  HENT:  Head: Normocephalic and atraumatic.  Nose: Nose normal.  Mouth/Throat: Oropharynx is clear and moist. No oropharyngeal exudate.  Eyes: Pupils are equal, round, and reactive to light. EOM are normal.  No conjunctival pallor  Neck: Normal range of motion. Neck supple.  Cardiovascular: Normal rate, regular rhythm,  normal heart sounds and intact distal pulses.   No murmur heard. Pulmonary/Chest: Effort normal and breath sounds normal. No respiratory distress. She has no wheezes. She has no rales. She exhibits no tenderness.  Abdominal: Soft. Bowel sounds are normal. She exhibits no distension and no mass. There is no tenderness. There is no rebound and no guarding.  No suprapubic or CVA tenderness Negative McBurney's and Murphy's   Genitourinary:  Genitourinary Comments:  External genitalia normal without erythema, edema, tenderness or lesions.  No groin lymphadenopathy.  Vaginal mucosa normal, pink without discharge or lesions.  +White, think discharge at top of vaginal vault and on cervix Uterus in midline, smooth, not enlarged or tender. No CMT.  +Adnexa are non palpable and w/o fullness but there is bilateral adnexal tenderness  Musculoskeletal: Normal range of motion. She exhibits no deformity.  Neurological: She is alert and oriented to person, place, and time. No sensory deficit.  Skin: Skin is warm and dry. Capillary refill takes less than 2 seconds.  Psychiatric: She has a normal mood and affect. Her behavior is normal. Judgment and thought content normal.  Nursing note and vitals reviewed.    ED Treatments / Results  Labs (all labs ordered are listed, but only abnormal results are displayed) Labs Reviewed  WET PREP, GENITAL - Abnormal; Notable for the following:       Result Value   Clue Cells Wet Prep HPF POC PRESENT (*)    WBC, Wet Prep HPF POC MANY (*)    All other components within normal limits  URINALYSIS, ROUTINE W REFLEX MICROSCOPIC - Abnormal; Notable for the following:    Hgb urine dipstick TRACE (*)    Leukocytes, UA MODERATE (*)    All other components within normal limits  URINALYSIS, MICROSCOPIC (REFLEX) - Abnormal; Notable for the following:    Bacteria, UA RARE (*)    Squamous Epithelial / LPF 6-30 (*)    All other components within normal limits  PREGNANCY, URINE  RPR  HIV ANTIBODY (ROUTINE TESTING)  GC/CHLAMYDIA PROBE AMP ([place]) NOT AT [facility]    EKG  EKG Interpretation None       Radiology No results found.  Procedures Procedures (including critical care time)  Medications Ordered in ED Medications - No data to display   Initial Impression / Assessment and Plan / ED Course  I have reviewed the triage vital signs and the nursing notes.  Pertinent labs & imaging results that were available during  my care of the patient were reviewed by me and considered in my medical decision making (see chart for details).    26 year old female presents to ED for evaluation of bleeding with urination only x 1 week associated with bilateral pelvic pain 2 days. Patient describes the bleeding as "my period" but states it only occurs after she urinates when wiping, and not throughout the day. She states the bleeding is not streaks of blood on the toilet paper but heavy like a menstrual cycle. Reports ongoing irregular periods for the last couple of months. She is getting menstrual cycles every 2 weeks, last "normal" menstrual cycle was 2 weeks ago.  Sexually active with women only.  She has family history of endometriosis and uterine fibroids.   On exam, vital signs are WNL. Abdomen is soft and only mildly tender bilaterally with no suprapubic or CVA tenderness. There was thin white discharge in vaginal vault and mildly tender but nonpalpable bilateral adnexa. Urinalysis without evidence of infection, trace Hgb only. Wet  prep shows bacterial vaginosis.   Final Clinical Impressions(s) / ED Diagnoses   Given history and reassuring exam, I don't think further emergent lab work or imaging is indicated at this time. Considering possible endometriosis vs fibroids causing irregular bleeding. History and exam not consistent with torsion, POA, ectopic pregnancy. It is odd that bleeding is only noted after urination, considered kidney stone or renal etiology however patient states that it's heavy bleeding and not just streaking. Patient considered safe for discharge at this time with strict ED return precautions. Gave her an formation for OB/GYN for further discussion of her regular menses. Flagyl for BV. GC/C, RPR, HIV pending.  Final diagnoses:  BV (bacterial vaginosis)  Irregular menstrual bleeding    New Prescriptions Discharge Medication List as of 10/12/2016  7:41 PM    START taking these medications   Details    metroNIDAZOLE (FLAGYL) 500 MG tablet Take 1 tablet (500 mg total) by mouth 2 (two) times daily., Starting Wed 10/12/2016, Print         Liberty Handy, PA-C 10/13/16 0047    Liberty Handy, PA-C 10/13/16 0047    Melene Plan, DO 10/13/16 [phone]

## 2016-10-12 NOTE — Discharge Instructions (Signed)
You were seen in the emergency department for irregular vaginal bleeding and bleeding with urination. You have bacterial vaginosis. Please take Flagyl as prescribed. Do not consume alcohol during the course of this medication as well have severe abdominal discomfort, nausea and vomiting. You should wait 3-5 days after completion of this medication to consume any alcohol. It is still unclear what is causing your vaginal bleeding to be irregular. Given your strong family history of uterine fibroids and endometriosis you may also have these conditions. Please contact OB/GYN as soon as possible for further evaluation and discussion of your symptoms. Return to the ED via develop fever, worsening abdominal pain, nausea, vomiting.  You do not have a yeast infection or Trichomonas. Your gonorrhea and chlamydia testing are still pending, you'll be called if the results are positive only. You can check your my chart account for the results.

## 2016-10-12 NOTE — ED Notes (Addendum)
Pt stated that she had an irregular period. Her period comes every 2 weeks (heavy period).  She is not on her period at this time but when she wiped herself,  she can see a good amount of blood associated with pain to her pelvis.

## 2016-10-12 NOTE — ED Triage Notes (Signed)
C/o pelvic pain, blood on tissue when she wipes after voiding-NAD-steady gait

## 2016-10-13 LAB — GC/CHLAMYDIA PROBE AMP (~~LOC~~) NOT AT ARMC
Chlamydia: NEGATIVE
NEISSERIA GONORRHEA: NEGATIVE

## 2016-10-13 LAB — HIV ANTIBODY (ROUTINE TESTING W REFLEX): HIV Screen 4th Generation wRfx: NONREACTIVE

## 2016-10-13 LAB — RPR: RPR Ser Ql: NONREACTIVE

## 2017-02-23 ENCOUNTER — Encounter (HOSPITAL_BASED_OUTPATIENT_CLINIC_OR_DEPARTMENT_OTHER): Payer: Self-pay | Admitting: *Deleted

## 2017-02-23 ENCOUNTER — Emergency Department (HOSPITAL_BASED_OUTPATIENT_CLINIC_OR_DEPARTMENT_OTHER)
Admission: EM | Admit: 2017-02-23 | Discharge: 2017-02-23 | Disposition: A | Discharge status: Home / Self Care | Payer: Medicaid Other | Attending: Emergency Medicine | Admitting: Emergency Medicine

## 2017-02-23 ENCOUNTER — Other Ambulatory Visit: Payer: Self-pay

## 2017-02-23 ENCOUNTER — Emergency Department (HOSPITAL_BASED_OUTPATIENT_CLINIC_OR_DEPARTMENT_OTHER): Payer: Medicaid Other

## 2017-02-23 DIAGNOSIS — Z87891 Personal history of nicotine dependence: Secondary | ICD-10-CM | POA: Insufficient documentation

## 2017-02-23 DIAGNOSIS — R102 Pelvic and perineal pain: Secondary | ICD-10-CM

## 2017-02-23 DIAGNOSIS — Z79899 Other long term (current) drug therapy: Secondary | ICD-10-CM | POA: Diagnosis not present

## 2017-02-23 DIAGNOSIS — J449 Chronic obstructive pulmonary disease, unspecified: Secondary | ICD-10-CM | POA: Diagnosis not present

## 2017-02-23 HISTORY — DX: Gastric ulcer, unspecified as acute or chronic, without hemorrhage or perforation: K25.9

## 2017-02-23 LAB — HCG, QUANTITATIVE, PREGNANCY: hCG, Beta Chain, Quant, S: <1 m[IU]/mL (ref <5)

## 2017-02-23 MED ORDER — IBUPROFEN 800 MG PO TABS
800.0000 mg | ORAL_TABLET | Freq: Once | ORAL | Status: AC
  Administered 2017-02-23: 800 mg via ORAL
  Filled 2017-02-23: qty 1

## 2017-02-23 MED ORDER — IBUPROFEN 800 MG PO TABS: 800.0000 mg | ORAL_TABLET | Freq: Three times a day (TID) | ORAL | 0 refills | Status: AC

## 2017-02-23 NOTE — ED Triage Notes (Addendum)
Pt /o pelvic pain x 1 week, seen in ED last week for same and by PMD yesterday for same, did not get meds filled.Reports OTC Motrin not working

## 2017-02-23 NOTE — ED Notes (Signed)
ED Provider at bedside. 

## 2017-02-23 NOTE — Discharge Instructions (Signed)
You are not pregnant.   I have printed the ultrasound results for you today. It appears that you have uterine fibroids. Please follow up with OBGYN for further evaluation. I have listed information below to women's clinic in [place].   Please take the antibiotics that have been prescribed to you for PID. It is important that you take the complete dose and let all of your sexual partners that they need to be tested for STDs.   Return to the ER if you develop worsening pain with fever greater 100.4 F, vomiting that will not stop or have any new or worsening symptoms.

## 2017-02-23 NOTE — ED Provider Notes (Signed)
[facility] EMERGENCY DEPARTMENT Provider Note   CSN: [id number] Arrival date & time: 02/23/17  4098     History   Chief Complaint Chief Complaint  Patient presents with  . Pelvic Pain    HPI Kara Fischer is a 27 y.o. female.  HPI   Kara Fischer is a 27 year old female with a history of depression who presents to the emergency department for evaluation of right sided pelvic pain.  Per chart review patient was seen at her primary care office yesterday where she was found to have yellow vaginal d/c and +CMT,  treated for PID.  Labs yesterday revealed no leukocytosis and no infection on UA.  She received 1 g of Rocephin at her primary office and given a prescription for doxycycline and Flagyl.  Patient states that she has not picked up her prescribed antibiotics.  Reports that she has had bilateral lower abdominal cramping for the past 2 days.  This morning when she woke up she had acutely worsened right pelvic pain.  Her pain is dull and aching in nature, but intermittently becomes sharp and severe.  States that her pain radiates into the anterior right thigh at times.  She tried taking Tylenol and applied heat over the pelvis without significant relief.  Reports that her pain is worsened when she walks or tries to move.  She denies fever, chills, nausea/vomiting, dysuria, urinary frequency, vaginal bleeding, vaginal discharge, chest pain, shortness of breath.  Denies any previous abdominal surgeries.  She reports that in the last 2 months she has been sexually active with one female partner and one female partner.  She does not always use condoms.  Reports last menstrual period ended 02/16/17.   Past Medical History:  Diagnosis Date  . COPD (chronic obstructive pulmonary disease) (HCC)   . Depression     There are no active problems to display for this patient.   History reviewed. No pertinent surgical history.  OB History    No data available       Home Medications     Prior to Admission medications   Medication Sig Start Date End Date Taking? Authorizing Provider  BusPIRone HCl (BUSPAR PO) Take by mouth.    [provider]  Escitalopram Oxalate (LEXAPRO PO) Take by mouth.    [provider]  metroNIDAZOLE (FLAGYL) 500 MG tablet Take 1 tablet (500 mg total) by mouth 2 (two) times daily. 10/12/16   Liberty Handy, PA-C  TRAZODONE HCL PO Take by mouth.    [provider]    Family History History reviewed. No pertinent family history.  Social History Social History   Tobacco Use  . Smoking status: Former Games developer  . Smokeless tobacco: Never Used  Substance Use Topics  . Alcohol use: Yes    Comment: occ  . Drug use: No     Allergies   Patient has no known allergies.   Review of Systems Review of Systems  Constitutional: Negative for chills, fatigue and fever.  Eyes: Negative for visual disturbance.  Respiratory: Negative for shortness of breath.   Cardiovascular: Negative for chest pain.  Gastrointestinal: Negative for abdominal pain, constipation, diarrhea, nausea and vomiting.  Genitourinary: Positive for pelvic pain. Negative for difficulty urinating, dysuria, flank pain, frequency, hematuria, menstrual problem, vaginal bleeding and vaginal discharge.  Musculoskeletal: Negative for back pain.  Skin: Negative for rash.  Neurological: Negative for dizziness, weakness, light-headedness and numbness.  Psychiatric/Behavioral: Negative for agitation.     Physical Exam Updated Vital  Signs BP 120/81   Pulse 67   Temp 97.7 F (36.5 C)   Resp 16   Wt 92.1 kg (203 lb)   LMP 02/10/2017   SpO2 100%   BMI 34.84 kg/m   Physical Exam  Constitutional: She is oriented to person, place, and time. She appears well-developed and well-nourished. No distress.  HENT:  Head: Normocephalic and atraumatic.  Mouth/Throat: Oropharynx is clear and moist. No oropharyngeal exudate.  Eyes: Conjunctivae are normal. Pupils  are equal, round, and reactive to light. Right eye exhibits no discharge. Left eye exhibits no discharge.  Neck: Normal range of motion. Neck supple.  Cardiovascular: Normal rate, regular rhythm and intact distal pulses. Exam reveals no friction rub.  No murmur heard. Pulmonary/Chest: Effort normal and breath sounds normal. No stridor. No respiratory distress. She has no wheezes. She has no rales.  Abdominal: Soft. There is no tenderness. There is no guarding.  Abdomen nontender to palpation.  No guarding or rigidity.  No rebound.  No CVA tenderness.  McBurney's point negative.  Musculoskeletal: Normal range of motion.  Lymphadenopathy:    She has no cervical adenopathy.  Neurological: She is alert and oriented to person, place, and time. Coordination normal.  Skin: Skin is warm and dry. Capillary refill takes less than 2 seconds. She is not diaphoretic.  Psychiatric: She has a normal mood and affect. Her behavior is normal.  Nursing note and vitals reviewed.    ED Treatments / Results  Labs (all labs ordered are listed, but only abnormal results are displayed) Labs Reviewed - No data to display  EKG  EKG Interpretation None       Radiology No results found.  Procedures Procedures (including critical care time)  Medications Ordered in ED Medications - No data to display   Initial Impression / Assessment and Plan / ED Course  I have reviewed the triage vital signs and the nursing notes.  Pertinent labs & imaging results that were available during my care of the patient were reviewed by me and considered in my medical decision making (see chart for details).    Patient presents with right-sided pelvic pain and cramping.  Of note, she was seen at her primary care office where she is being treated for PID given yellow vaginal discharge on pelvic exam with +CMT. She had blood work performed yesterday in which her WBC count WNL.  On exam she is afebrile and  nontoxic-appearing.  Vital signs stable.  She has no acute tenderness to palpation of the abdomen or groin area.  No peritoneal signs.  Suspect that her pelvic pain is related to PID.  Plan to get beta hCG to test for pregnancy and also transvaginal ultrasound to evaluate for ovarian torsion given her pain acutely worsened this morning.  Pregnancy test negative. Transvaginal ultrasound reveals several hypoechoic areas in the endometrium thought to be uterine fibroids.  No ovarian torsion.  No tubo-ovarian abscess. On recheck, patient states her pain has improved with ibuprofen.  Have discussed results with patient and given her information to follow-up with OB/GYN.  Have counseled her to fill her prescription for doxycycline and metronidazole which were prescribed to her by her family doctor.  She states that they are ready at her pharmacy and she is going to pick them up today. Have counseled her on use of NSAIDs for pain.  Have counseled her on return precautions and patient agrees and voiced understanding.  Patient has no complaints prior to discharge.   Final  Clinical Impressions(s) / ED Diagnoses   Final diagnoses:  Pelvic pain    ED Discharge Orders        Ordered    ibuprofen (ADVIL,MOTRIN) 800 MG tablet  3 times daily     02/23/17 1135       Lawrence Marseilles 02/23/17 1952    Nira Conn, MD 02/25/17 1233

## 2018-09-22 IMAGING — DX DG CHEST 2V
2 series · 2 of 2 positions shown · non-contrast
Comparison: None.

CLINICAL DATA: Cough, sore throat, ear pain, smoking history

EXAM:
CHEST  2 VIEW

[chest pa]
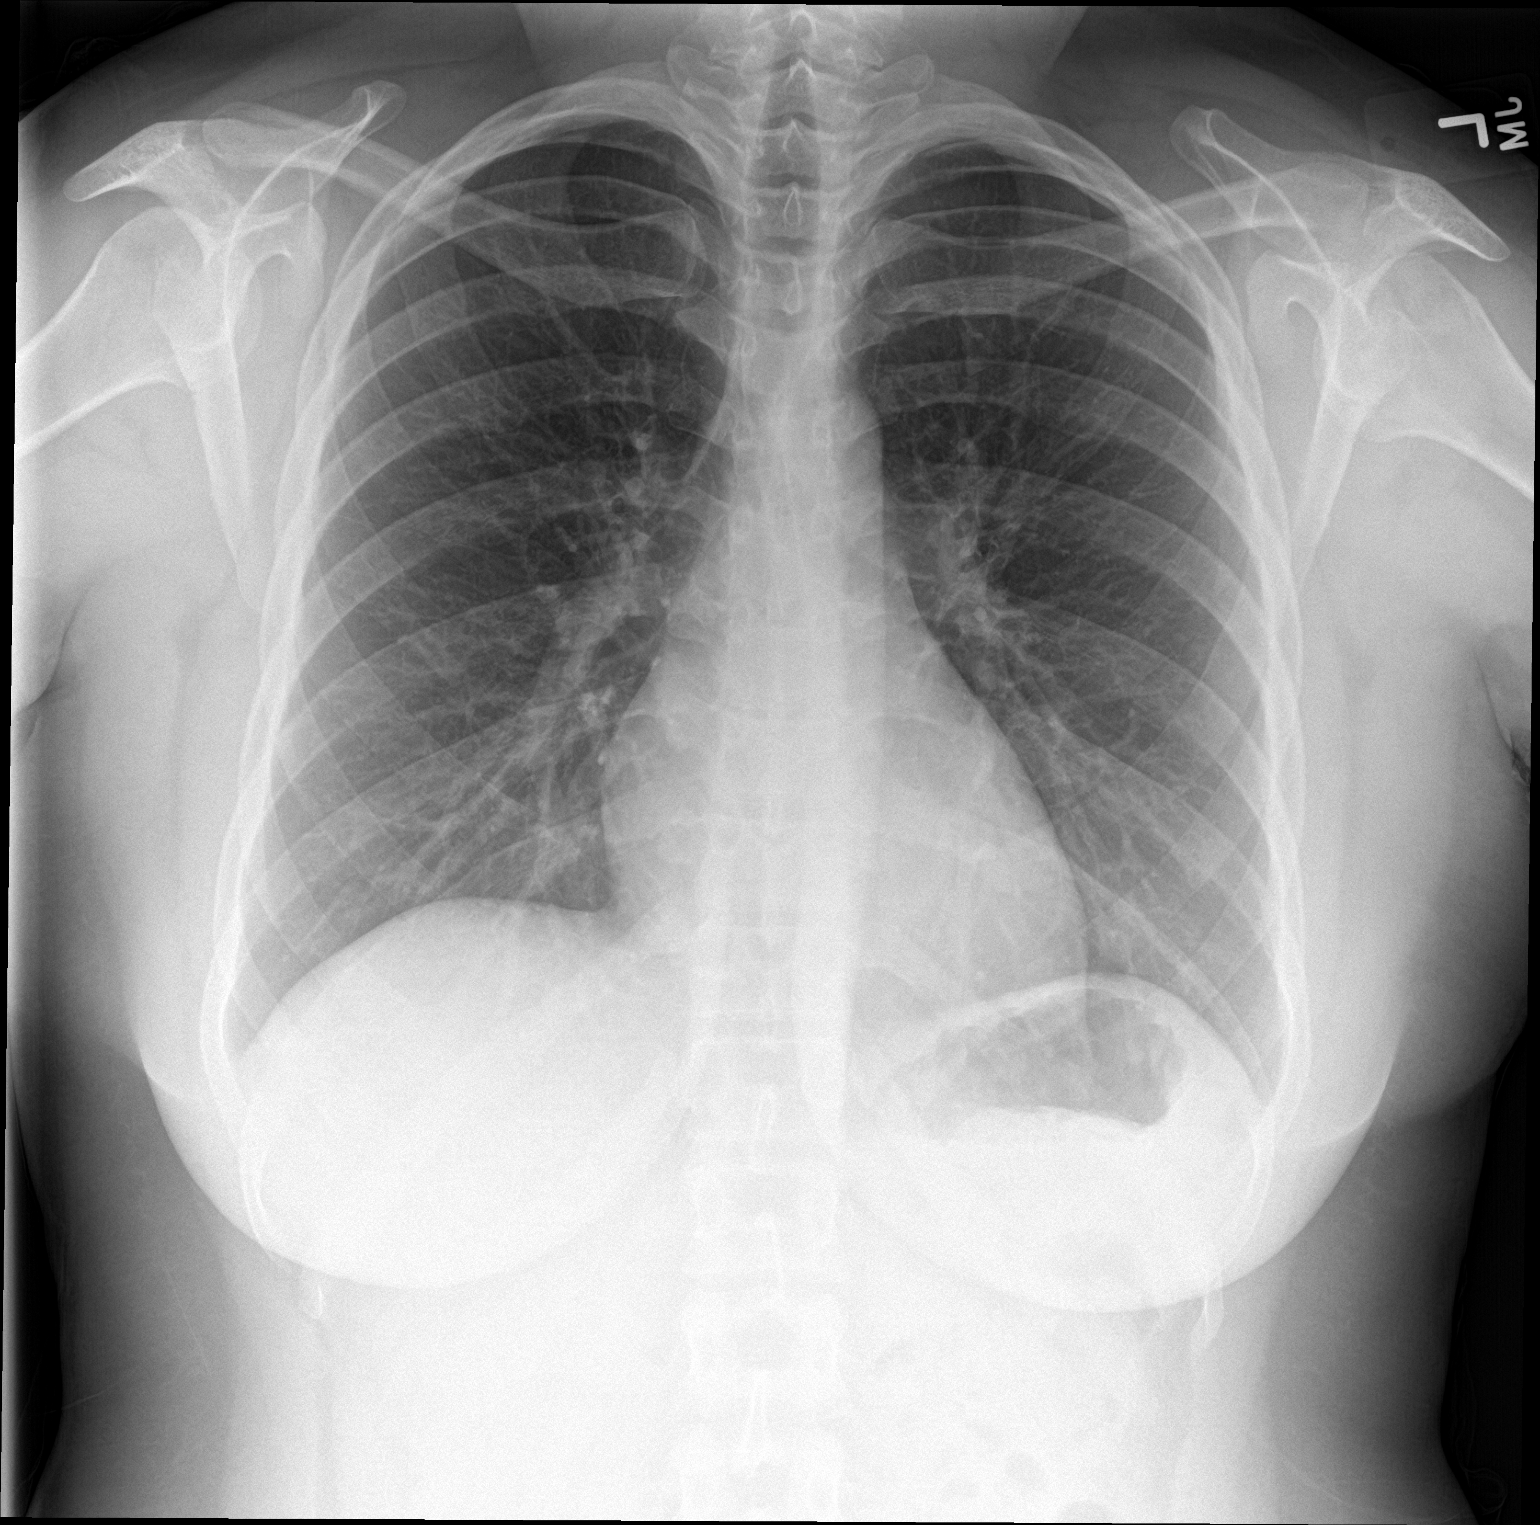

[chest lat]
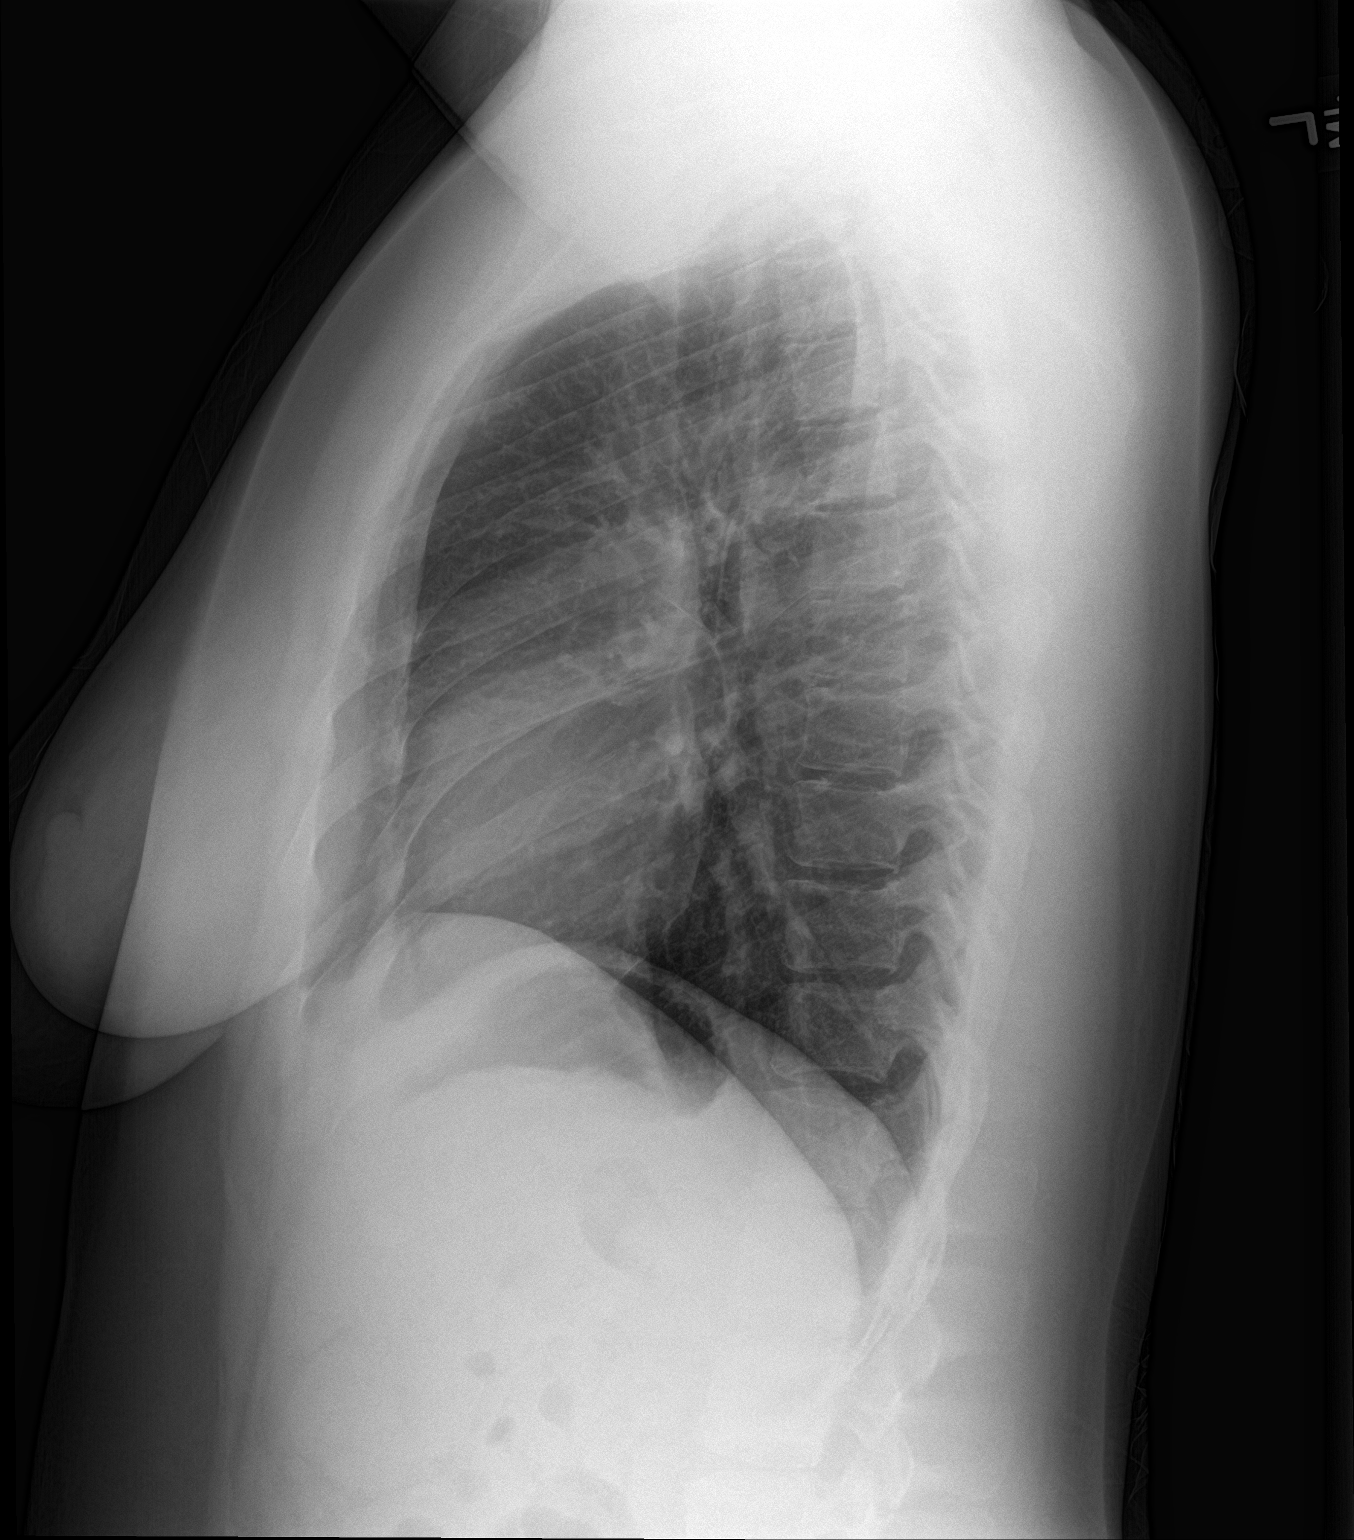

[2 of 2 positions shown; findings below may reference images not displayed]

FINDINGS: No active infiltrate or effusion is seen. Mediastinal and hilar
contours are unremarkable. The heart is within normal limits in
size. No bony abnormality is seen.
IMPRESSION: No active cardiopulmonary disease.

## 2022-02-21 ENCOUNTER — Emergency Department (HOSPITAL_COMMUNITY): Payer: Medicaid Other

## 2022-02-21 ENCOUNTER — Encounter (HOSPITAL_COMMUNITY): Payer: Self-pay

## 2022-02-21 ENCOUNTER — Other Ambulatory Visit: Payer: Self-pay

## 2022-02-21 ENCOUNTER — Emergency Department (HOSPITAL_COMMUNITY)
Admission: EM | Admit: 2022-02-21 | Discharge: 2022-02-21 | Disposition: A | Discharge status: Home / Self Care | Payer: Medicaid Other | Attending: Emergency Medicine | Admitting: Emergency Medicine

## 2022-02-21 ENCOUNTER — Emergency Department (HOSPITAL_BASED_OUTPATIENT_CLINIC_OR_DEPARTMENT_OTHER): Payer: Medicaid Other

## 2022-02-21 DIAGNOSIS — R52 Pain, unspecified: Secondary | ICD-10-CM | POA: Diagnosis not present

## 2022-02-21 DIAGNOSIS — M79661 Pain in right lower leg: Secondary | ICD-10-CM | POA: Diagnosis not present

## 2022-02-21 DIAGNOSIS — M25561 Pain in right knee: Secondary | ICD-10-CM | POA: Insufficient documentation

## 2022-02-21 DIAGNOSIS — F17200 Nicotine dependence, unspecified, uncomplicated: Secondary | ICD-10-CM | POA: Diagnosis not present

## 2022-02-21 LAB — BASIC METABOLIC PANEL
Anion gap: 10 (ref 5–15)
BUN: 10 mg/dL (ref 6–20)
CO2: 27 mmol/L (ref 22–32)
Calcium: 9.3 mg/dL (ref 8.9–10.3)
Chloride: 100 mmol/L (ref 98–111)
Creatinine, Ser: 0.8 mg/dL (ref 0.44–1.00)
GFR, Estimated: >60 mL/min (ref >60)
Glucose, Bld: 95 mg/dL (ref 70–99)
Potassium: 4 mmol/L (ref 3.5–5.1)
Sodium: 137 mmol/L (ref 135–145)

## 2022-02-21 LAB — CBC WITH DIFFERENTIAL/PLATELET
Abs Immature Granulocytes: 0.01 10*3/uL (ref 0.00–0.07)
Basophils Absolute: 0 10*3/uL (ref 0.0–0.1)
Basophils Relative: 0 %
Eosinophils Absolute: 0.1 10*3/uL (ref 0.0–0.5)
Eosinophils Relative: 1 %
HCT: 35.7 % — ABNORMAL LOW (ref 36.0–46.0)
Hemoglobin: 11.6 g/dL — ABNORMAL LOW (ref 12.0–15.0)
Immature Granulocytes: 0 %
Lymphocytes Relative: 34 %
Lymphs Abs: 1.9 10*3/uL (ref 0.7–4.0)
MCH: 26.2 pg (ref 26.0–34.0)
MCHC: 32.5 g/dL (ref 30.0–36.0)
MCV: 80.8 fL (ref 80.0–100.0)
Monocytes Absolute: 0.3 10*3/uL (ref 0.1–1.0)
Monocytes Relative: 5 %
Neutro Abs: 3.4 10*3/uL (ref 1.7–7.7)
Neutrophils Relative %: 60 %
Platelets: 369 10*3/uL (ref 150–400)
RBC: 4.42 MIL/uL (ref 3.87–5.11)
RDW: 14.7 % (ref 11.5–15.5)
WBC: 5.6 10*3/uL (ref 4.0–10.5)
nRBC: 0 % (ref 0.0–0.2)

## 2022-02-21 NOTE — Discharge Instructions (Addendum)
Wear compression stockings during long drives.  You can take Tylenol and Motrin for the knee pain.  If resolving pain to the posterior knee follow-up with your doctor in about a week you may need to have a repeat ultrasound done.  Return to the ED for chest pain or shortness of breath.

## 2022-02-21 NOTE — Progress Notes (Signed)
Right lower extremity venous duplex has been completed. Preliminary results can be found in CV Proc through chart review.  Results were given to Lourdes Sledge PA.  02/21/22 3:44 PM Kara Fischer RVT

## 2022-02-21 NOTE — ED Triage Notes (Signed)
Patient c/o right knee pain x a few weeks. 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 Patient also c/o slight swelling to the right knee

## 2022-02-21 NOTE — ED Provider Notes (Signed)
[facility]-EMERGENCY DEPT Provider Note   CSN: [id number] Arrival date & time: 02/21/22  1338     History  Chief Complaint  Patient presents with   Knee Pain    Kara Fischer is a 32 y.o. female.   Knee Pain    Patient with medical history of tobacco abuse presents to the emergency department due to right knee pain.  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  The pain is most likely posterior to the right knee, is worse with any ambulation or weightbearing.  Denies any fevers or chills or skin discoloration.  No chest pain or shortness of breath.  Has had no medicine prior to arrival, no history of PE or DVTs, not on oral birth control.  Home Medications Prior to Admission medications   Medication Sig Start Date End Date Taking? Authorizing Provider  BusPIRone HCl (BUSPAR PO) Take by mouth.    [provider]  Escitalopram Oxalate (LEXAPRO PO) Take by mouth.    [provider]  ibuprofen (ADVIL,MOTRIN) 800 MG tablet Take 1 tablet (800 mg total) by mouth 3 (three) times daily. 02/23/17   Kellie Shropshire, PA-C  metroNIDAZOLE (FLAGYL) 500 MG tablet Take 1 tablet (500 mg total) by mouth 2 (two) times daily. 10/12/16   Liberty Handy, PA-C  TRAZODONE HCL PO Take by mouth.    [provider]      Allergies    Patient has no known allergies.    Review of Systems   Review of Systems  Physical Exam Updated Vital Signs BP 122/82 (BP Location: Left Arm)   Pulse 79   Temp 97.7 F (36.5 C) (Oral)   Resp 16   Ht 5' 4.5" (1.638 m)   Wt 90.7 kg   LMP 02/12/2022 (Exact Date)   SpO2 99%   BMI 33.80 kg/m  Physical Exam Vitals and nursing note reviewed. Exam conducted with a chaperone present.  Constitutional:      Appearance: Normal appearance.  HENT:     Head: Normocephalic and atraumatic.  Eyes:     General: No scleral icterus.        Right eye: No discharge.        Left eye: No discharge.     Extraocular Movements: Extraocular movements intact.     Pupils: Pupils are equal, round, and reactive to light.  Cardiovascular:     Rate and Rhythm: Normal rate and regular rhythm.     Pulses: Normal pulses.     Heart sounds: Normal heart sounds.     No friction rub. No gallop.  Pulmonary:     Effort: Pulmonary effort is normal. No respiratory distress.     Breath sounds: Normal breath sounds.  Abdominal:     General: Abdomen is flat. Bowel sounds are normal. There is no distension.     Palpations: Abdomen is soft.     Tenderness: There is no abdominal tenderness.  Musculoskeletal:        General: Tenderness present.     Comments: Tolerates passive and active ROM to the right knee.  No laxity, reproducible tenderness over the patella.  Skin:    General: Skin is warm and dry.     Coloration: Skin is not jaundiced.     Findings: No erythema.     Comments: No warmth or erythema  Neurological:     Mental Status: She  is alert. Mental status is at baseline.     Coordination: Coordination normal.     ED Results / Procedures / Treatments   Labs (all labs ordered are listed, but only abnormal results are displayed) Labs Reviewed  CBC WITH DIFFERENTIAL/PLATELET - Abnormal; Notable for the following components:      Result Value   Hemoglobin 11.6 (*)    HCT 35.7 (*)    All other components within normal limits  BASIC METABOLIC PANEL    EKG None  Radiology VAS [place] LOWER EXTREMITY VENOUS (DVT) (7a-7p)  Result Date: 02/21/2022  Lower Venous DVT Study Patient Name:  Kara Fischer  Date of Exam:   02/21/2022 Medical Rec #: [id number]      Accession #:    [id number] Date of Birth: [birth year]      Patient Gender: F Patient Age:   27 years Exam Location:  [facility] Procedure:      VAS [place] LOWER EXTREMITY VENOUS (DVT) Referring Phys: Lourdes Sledge  --------------------------------------------------------------------------------  Indications: Pain.  Risk Factors: None identified. Comparison Study: No prior studies. Performing Technologist: Oliver Hum RVT  Examination Guidelines: A complete evaluation includes B-mode imaging, spectral Doppler, color Doppler, and power Doppler as needed of all accessible portions of each vessel. Bilateral testing is considered an integral part of a complete examination. Limited examinations for reoccurring indications may be performed as noted. The reflux portion of the exam is performed with the patient in reverse Trendelenburg.  +---------+---------------+---------+-----------+----------+--------------+ RIGHT    CompressibilityPhasicitySpontaneityPropertiesThrombus Aging +---------+---------------+---------+-----------+----------+--------------+ CFV      Full           Yes      Yes                                 +---------+---------------+---------+-----------+----------+--------------+ SFJ      Full                                                        +---------+---------------+---------+-----------+----------+--------------+ FV Prox  Full                                                        +---------+---------------+---------+-----------+----------+--------------+ FV Mid   Full                                                        +---------+---------------+---------+-----------+----------+--------------+ FV DistalFull                                                        +---------+---------------+---------+-----------+----------+--------------+ PFV      Full                                                        +---------+---------------+---------+-----------+----------+--------------+  POP      Full           Yes      Yes                                 +---------+---------------+---------+-----------+----------+--------------+ PTV      Full                                                         +---------+---------------+---------+-----------+----------+--------------+ PERO     Full                                                        +---------+---------------+---------+-----------+----------+--------------+   +----+---------------+---------+-----------+----------+--------------+ LEFTCompressibilityPhasicitySpontaneityPropertiesThrombus Aging +----+---------------+---------+-----------+----------+--------------+ CFV Full           Yes      Yes                                 +----+---------------+---------+-----------+----------+--------------+     Summary: RIGHT: - There is no evidence of deep vein thrombosis in the lower extremity.  - No cystic structure found in the popliteal fossa.  LEFT: - No evidence of common femoral vein obstruction.  *See table(s) above for measurements and observations. Electronically signed by Sherald Hess MD on 02/21/2022 at 3:49:36 PM.    Final    DG Knee 2 Views Right  Result Date: 02/21/2022 CLINICAL DATA:  Right knee pain EXAM: RIGHT KNEE - 1-2 VIEW COMPARISON:  None Available. FINDINGS: No evidence of fracture, dislocation, or joint effusion. No evidence of arthropathy or other focal bone abnormality. Soft tissues are unremarkable. IMPRESSION: Negative. Electronically Signed   By: Judie Petit.  Shick M.D.   On: 02/21/2022 15:10    Procedures Procedures    Medications Ordered in ED Medications - No data to display  ED Course/ Medical Decision Making/ A&P                           Medical Decision Making Amount and/or Complexity of Data Reviewed Labs: ordered. Radiology: ordered.   Patient presents due to right knee pain.  Differential includes but not limited to septic joint, DVT, fractures, dislocation, muscle strain.  Patient is afebrile, tolerates passive ROM and I do not think this is septic joint.  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  Will also check x-ray.  I ordered, viewed and interpreted imaging studies.  Negative for fractures or dislocations, ultrasound is negative for DVT.  I suspect this is most likely strain or cramping from decreased mobility.  We discussed inserting more breaks into her work life, compression stockings and close follow-up with her PCP.  If she continues having the pain in the posterior calf/knee she will return to the week for repeat ultrasound in case of missed DVT.  Return precaution discussed with the patient who verbalized understanding and agreement the  plan.  Discharged in stable condition.        Final Clinical Impression(s) / ED Diagnoses Final diagnoses:  Acute pain of right knee    Rx / DC Orders ED Discharge Orders     None         Theron Arista, PA-C 02/21/22 1657    Rolan Bucco, MD 02/21/22 2149

## 2022-02-21 NOTE — ED Notes (Signed)
I called patient back to be triage and no one responded 

## 2022-02-21 NOTE — ED Provider Triage Note (Signed)
Emergency Medicine Provider Triage Evaluation Note  Kara Fischer , a 32 y.o. female  was evaluated in triage.  Pt complains of right knee pain.  Patient reports that she has had right knee pain for a few weeks.  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  Patient denies any past history of any blood clots of any type.  Not currently on any blood thinners.  Patient also not currently on any hormonal contraceptive.  Denies chest pain, shortness of breath, abdominal pain, headaches, vomiting, diarrhea..  Review of Systems  Positive: As above Negative: As above  Physical Exam  BP 122/82 (BP Location: Left Arm)   Pulse 79   Temp 97.7 F (36.5 C) (Oral)   Resp 16   Ht 5' 4.5" (1.638 m)   Wt 90.7 kg   LMP 02/12/2022 (Exact Date)   SpO2 99%   BMI 33.80 kg/m  Gen:   Awake, no distress   Resp:  Normal effort clear to auscultation bilaterally MSK:   Moves extremities without difficulty, pain on deep palpation of posterior right knee mid to posterior right thigh Other:    Medical Decision Making  Medically screening exam initiated at 2:45 PM.  Appropriate orders placed.  Maricsa Sammons was informed that the remainder of the evaluation will be completed by another provider, this initial triage assessment does not replace that evaluation, and the importance of remaining in the ED until their evaluation is complete.     Luvenia Heller, PA-C 02/21/22 1447
